# Patient Record
Sex: Male | Born: 1975 | Race: White | Hispanic: No | Marital: Married | Smoking: Former smoker
Health system: Southern US, Community
[De-identification: ages and names within clinical notes are randomized; demographics above are authoritative.]

## PROBLEM LIST (undated history)

## (undated) DIAGNOSIS — E119 Type 2 diabetes mellitus without complications: Secondary | ICD-10-CM

## (undated) DIAGNOSIS — E66811 Obesity, class 1: Secondary | ICD-10-CM

## (undated) DIAGNOSIS — E782 Mixed hyperlipidemia: Secondary | ICD-10-CM

## (undated) DIAGNOSIS — K219 Gastro-esophageal reflux disease without esophagitis: Secondary | ICD-10-CM

## (undated) DIAGNOSIS — I10 Essential (primary) hypertension: Secondary | ICD-10-CM

## (undated) DIAGNOSIS — E669 Obesity, unspecified: Secondary | ICD-10-CM

## (undated) HISTORY — PX: NO PAST SURGERIES: SHX2092

---

## 2019-12-02 ENCOUNTER — Encounter: Payer: Self-pay | Admitting: Emergency Medicine

## 2019-12-02 ENCOUNTER — Emergency Department
Admission: EM | Admit: 2019-12-02 | Discharge: 2019-12-02 | Disposition: A | Discharge status: Home / Self Care | Payer: 59 | Attending: Emergency Medicine | Admitting: Emergency Medicine

## 2019-12-02 ENCOUNTER — Other Ambulatory Visit: Payer: Self-pay

## 2019-12-02 ENCOUNTER — Emergency Department: Payer: 59

## 2019-12-02 DIAGNOSIS — N492 Inflammatory disorders of scrotum: Secondary | ICD-10-CM | POA: Insufficient documentation

## 2019-12-02 DIAGNOSIS — N5089 Other specified disorders of the male genital organs: Secondary | ICD-10-CM | POA: Insufficient documentation

## 2019-12-02 DIAGNOSIS — L0390 Cellulitis, unspecified: Secondary | ICD-10-CM

## 2019-12-02 DIAGNOSIS — N50812 Left testicular pain: Secondary | ICD-10-CM | POA: Insufficient documentation

## 2019-12-02 DIAGNOSIS — R1032 Left lower quadrant pain: Secondary | ICD-10-CM

## 2019-12-02 DIAGNOSIS — R109 Unspecified abdominal pain: Secondary | ICD-10-CM | POA: Diagnosis present

## 2019-12-02 DIAGNOSIS — I10 Essential (primary) hypertension: Secondary | ICD-10-CM | POA: Diagnosis not present

## 2019-12-02 HISTORY — DX: Essential (primary) hypertension: I10

## 2019-12-02 LAB — URINALYSIS, COMPLETE (UACMP) WITH MICROSCOPIC
Bacteria, UA: NONE SEEN
Bilirubin Urine: NEGATIVE
Glucose, UA: NEGATIVE mg/dL
Hgb urine dipstick: NEGATIVE
Ketones, ur: NEGATIVE mg/dL
Leukocytes,Ua: NEGATIVE
Nitrite: NEGATIVE
Protein, ur: NEGATIVE mg/dL
Specific Gravity, Urine: 1.017 (ref 1.005–1.030)
Squamous Epithelial / HPF: NONE SEEN (ref 0–5)
pH: 5 (ref 5.0–8.0)

## 2019-12-02 MED ORDER — IBUPROFEN 600 MG PO TABS
600.0000 mg | ORAL_TABLET | Freq: Three times a day (TID) | ORAL | 0 refills | Status: AC | PRN
Start: 2019-12-02 — End: ?

## 2019-12-02 MED ORDER — DOXYCYCLINE HYCLATE 100 MG PO CAPS
100.0000 mg | ORAL_CAPSULE | Freq: Two times a day (BID) | ORAL | 0 refills | Status: AC
Start: 2019-12-02 — End: 2019-12-09

## 2019-12-02 MED ORDER — HYDROCODONE-ACETAMINOPHEN 5-325 MG PO TABS: 1.0000 | ORAL_TABLET | Freq: Four times a day (QID) | ORAL | 0 refills | Status: AC | PRN

## 2019-12-02 NOTE — ED Provider Notes (Signed)
[facility] Emergency Department Provider Note  ____________________________________________   First MD Initiated Contact with Patient 12/02/19 1448     (approximate)  I have reviewed the triage vital signs and the nursing notes.   HISTORY  Chief Complaint Groin Pain and Testicle Pain    HPI Roger Harris is a 44 y.o. male  With h/o HTN here with groin pain, testicle pain. Pt reports that for the past 24 hours, he's had progressively worsening aching, throbbing, and now severe 10/10 left groin and testicular pain. Pain began gradually. No testicular twisting or asymmetry. No trauma but he does walk outside a lot for work and also lifts occasional heavy manhole covers. No fever, chills. No dysuria, frequency. He thought it may be related to constipation so he took ex-lax, without relief. No nausea, vomiting, or chills. Pain relieves with rest.        Past Medical History:  Diagnosis Date  . Hypertension     There are no problems to display for this patient.     Prior to Admission medications   Medication Sig Start Date End Date Taking? Authorizing Provider  buPROPion (WELLBUTRIN SR) 150 MG 12 hr tablet Take 150 mg by mouth 2 (two) times daily.   Yes [provider]  hydrochlorothiazide (HYDRODIURIL) 25 MG tablet Take 25 mg by mouth daily.   Yes [provider]  losartan (COZAAR) 50 MG tablet Take 50 mg by mouth daily.   Yes [provider]  lovastatin (MEVACOR) 40 MG tablet Take 40 mg by mouth at bedtime.   Yes [provider]  doxycycline (VIBRAMYCIN) 100 MG capsule Take 1 capsule (100 mg total) by mouth 2 (two) times daily for 7 days. 12/02/19 12/09/19  Shaune Pollack, MD  HYDROcodone-acetaminophen (NORCO/VICODIN) 5-325 MG tablet Take 1-2 tablets by mouth every 6 (six) hours as needed for moderate pain or severe pain. 12/02/19 12/01/20  Shaune Pollack, MD  ibuprofen (ADVIL) 600 MG tablet Take 1 tablet (600 mg total)  by mouth every 8 (eight) hours as needed for mild pain. 12/02/19   Shaune Pollack, MD    Allergies Patient has no known allergies.  No family history on file.  Social History Social History   Tobacco Use  . Smoking status: Not on file  Substance Use Topics  . Alcohol use: Not on file  . Drug use: Not on file    Review of Systems  Review of Systems  Constitutional: Negative for chills and fever.  HENT: Negative for sore throat.   Respiratory: Negative for shortness of breath.   Cardiovascular: Negative for chest pain.  Gastrointestinal: Negative for abdominal pain.  Genitourinary: Positive for scrotal swelling and testicular pain. Negative for flank pain.  Musculoskeletal: Negative for neck pain.  Skin: Negative for rash and wound.  Allergic/Immunologic: Negative for immunocompromised state.  Neurological: Negative for weakness and numbness.  Hematological: Does not bruise/bleed easily.     ____________________________________________  PHYSICAL EXAM:      VITAL SIGNS: ED Triage Vitals  Enc Vitals Group     BP 12/02/19 1347 125/88     Pulse Rate 12/02/19 1347 87     Resp 12/02/19 1347 18     Temp 12/02/19 1347 98.9 F (37.2 C)     Temp Source 12/02/19 1347 Oral     SpO2 12/02/19 1347 96 %     Weight 12/02/19 1348 240 lb (108.9 kg)     Height 12/02/19 1348 5\' 10"  (1.778 m)  Head Circumference --      Peak Flow --      Pain Score 12/02/19 1348 9     Pain Loc --      Pain Edu? --      Excl. in GC? --      Physical Exam Vitals and nursing note reviewed.  Constitutional:      General: He is not in acute distress.    Appearance: He is well-developed.  HENT:     Head: Normocephalic and atraumatic.  Eyes:     Conjunctiva/sclera: Conjunctivae normal.  Cardiovascular:     Rate and Rhythm: Normal rate and regular rhythm.     Heart sounds: Normal heart sounds.  Pulmonary:     Effort: Pulmonary effort is normal. No respiratory distress.     Breath sounds:  No wheezing.  Abdominal:     General: There is no distension.  Genitourinary:    Comments: Marked TTP over left hemiscrotum. No overt erythema. Possible slight induration. Normal testicular lie and cremasterics are intact. No appreciable inguinal hernia. Musculoskeletal:     Cervical back: Neck supple.  Skin:    General: Skin is warm.     Capillary Refill: Capillary refill takes less than 2 seconds.     Findings: No rash.  Neurological:     Mental Status: He is alert and oriented to person, place, and time.     Motor: No abnormal muscle tone.       ____________________________________________   LABS (all labs ordered are listed, but only abnormal results are displayed)  Labs Reviewed  URINALYSIS, COMPLETE (UACMP) WITH MICROSCOPIC - Abnormal; Notable for the following components:      Result Value   Color, Urine YELLOW (*)    APPearance CLEAR (*)    All other components within normal limits    ____________________________________________  EKG: none ________________________________________  RADIOLOGY All imaging, including plain films, CT scans, and ultrasounds, independently reviewed by me, and interpretations confirmed via formal radiology reads.  ED MD interpretation:   [place]: Mild asymmetric thickening of left scrotal wall, small hydrocele  Official radiology report(s): US SCROTUM W/DOPPLER  Result Date: 12/02/2019 CLINICAL DATA:  Left testicular and groin pain.  Remote vasectomy. EXAM: SCROTAL ULTRASOUND DOPPLER ULTRASOUND OF THE TESTICLES TECHNIQUE: Complete ultrasound examination of the testicles, epididymis, and other scrotal structures was performed. Color and spectral Doppler ultrasound were also utilized to evaluate blood flow to the testicles. COMPARISON:  None. FINDINGS: Right testicle Measurements: 4.5 x 2.5 x 3.1 cm. No mass or microlithiasis visualized. Normal parenchymal echogenicity and echotexture. Normal parenchymal color flow vascularity. Left testicle  Measurements: 3.9 x 2.6 x 3.0 cm. No mass or microlithiasis visualized. Normal parenchymal echogenicity and echotexture. Normal parenchymal color flow vascularity. Right epididymis: Normal in size and appearance. Normal vascularity. Left epididymis: Normal in size and appearance. Normal vascularity. Hydrocele:  Tiny left simple appearing hydrocele is present. Varicocele:  None visualized. Pulsed Doppler interrogation of both testes demonstrates normal low resistance arterial and venous waveforms bilaterally. Other: There is mild asymmetric thickening of the left scrotal wall, possibly related to edema or inflammation. IMPRESSION: Tiny left hydrocele, nonspecific. Mild asymmetric thickening of the left scrotal wall, possibly related to asymmetric edema or inflammation. Normal examination of the scrotal contents. Normal testicular vascularity. Electronically Signed   By: Helyn Numbers MD   On: 12/02/2019 15:44    ____________________________________________  PROCEDURES   Procedure(s) performed (including Critical Care):  Procedures  ____________________________________________  INITIAL IMPRESSION / MDM / ASSESSMENT  AND PLAN / ED COURSE  As part of my medical decision making, I reviewed the following data within the electronic MEDICAL RECORD NUMBER Nursing notes reviewed and incorporated, Old chart reviewed, Notes from prior ED visits, and [place] Controlled Substance Database       *Rodman Recupero was evaluated in Emergency Department on 12/02/2019 for the symptoms described in the history of present illness. He was evaluated in the context of the global COVID-19 pandemic, which necessitated consideration that the patient might be at risk for infection with the SARS-CoV-2 virus that causes COVID-19. Institutional protocols and algorithms that pertain to the evaluation of patients at risk for COVID-19 are in a state of rapid change based on information released by regulatory bodies including the CDC and  federal and state organizations. These policies and algorithms were followed during the patient's care in the ED.  Some ED evaluations and interventions may be delayed as a result of limited staffing during the pandemic.*     Medical Decision Making:  44 yo M here with left scrotal pain. On exam, pt has no signs of torsion. U/S obtained shows thickening of scrotal wall concerning for edema vs infection, but no abscess, torsion, mass, or other abnormality. UA is without signs of UTI. History, exam is not concerning for STD/orchitis/epididymitis. Suspect mild scrotal wall cellulitis 2/2 microtrauma at his job/chafing. Will treat with doxy for broad-spectrum and MRSA coverage, encourage good hygiene and monitoring, and d/c with analgesics.  ____________________________________________  FINAL CLINICAL IMPRESSION(S) / ED DIAGNOSES  Final diagnoses:  Pain in left testicle  Cellulitis, unspecified cellulitis site     MEDICATIONS GIVEN DURING THIS VISIT:  Medications - No data to display   ED Discharge Orders         Ordered    doxycycline (VIBRAMYCIN) 100 MG capsule  2 times daily     Discontinue  Reprint     12/02/19 1629    ibuprofen (ADVIL) 600 MG tablet  Every 8 hours PRN     Discontinue  Reprint     12/02/19 1629    HYDROcodone-acetaminophen (NORCO/VICODIN) 5-325 MG tablet  Every 6 hours PRN     Discontinue  Reprint     12/02/19 1629           Note:  This document was prepared using Dragon voice recognition software and may include unintentional dictation errors.   Shaune Pollack, MD 12/02/19 [phone]

## 2019-12-02 NOTE — ED Triage Notes (Signed)
Patient presents to the ED with left groin pain and left testicular pain since yesterday.  Patient states he has been urinating less than normal.  Patient states his left testicle is slightly swollen.  Patient states testicle is tender to the touch.

## 2019-12-02 NOTE — Discharge Instructions (Signed)
Wear tighter fitting underwear to help support your testicle  Try to minimize heavy lifting  Try to keep the area as dry and clean as possible  Take the antibiotics as prescribed

## 2019-12-02 NOTE — ED Notes (Signed)
See triage note  Presents with left groin and testicle pain   States pain started yesterday  No urinary sx's

## 2020-12-21 ENCOUNTER — Encounter: Payer: Self-pay | Admitting: *Deleted

## 2020-12-22 ENCOUNTER — Encounter: Payer: Self-pay | Admitting: *Deleted

## 2020-12-22 ENCOUNTER — Encounter
Admission: RE | Disposition: A | Discharge status: Home / Self Care | Payer: Self-pay | Source: Home / Self Care | Attending: Gastroenterology

## 2020-12-22 ENCOUNTER — Ambulatory Visit: Payer: 59 | Admitting: Anesthesiology

## 2020-12-22 ENCOUNTER — Ambulatory Visit
Admission: RE | Admit: 2020-12-22 | Discharge: 2020-12-22 | Disposition: A | Discharge status: Home / Self Care | Payer: 59 | Attending: Gastroenterology | Admitting: Gastroenterology

## 2020-12-22 DIAGNOSIS — Z888 Allergy status to other drugs, medicaments and biological substances status: Secondary | ICD-10-CM | POA: Diagnosis not present

## 2020-12-22 DIAGNOSIS — Z79899 Other long term (current) drug therapy: Secondary | ICD-10-CM | POA: Insufficient documentation

## 2020-12-22 DIAGNOSIS — Z1211 Encounter for screening for malignant neoplasm of colon: Secondary | ICD-10-CM | POA: Insufficient documentation

## 2020-12-22 DIAGNOSIS — Z8371 Family history of colonic polyps: Secondary | ICD-10-CM | POA: Insufficient documentation

## 2020-12-22 DIAGNOSIS — Z87891 Personal history of nicotine dependence: Secondary | ICD-10-CM | POA: Insufficient documentation

## 2020-12-22 DIAGNOSIS — Z7984 Long term (current) use of oral hypoglycemic drugs: Secondary | ICD-10-CM | POA: Diagnosis not present

## 2020-12-22 HISTORY — DX: Gastro-esophageal reflux disease without esophagitis: K21.9

## 2020-12-22 HISTORY — DX: Obesity, class 1: E66.811

## 2020-12-22 HISTORY — DX: Mixed hyperlipidemia: E78.2

## 2020-12-22 HISTORY — DX: Obesity, unspecified: E66.9

## 2020-12-22 HISTORY — DX: Type 2 diabetes mellitus without complications: E11.9

## 2020-12-22 HISTORY — PX: COLONOSCOPY WITH PROPOFOL: SHX5780

## 2020-12-22 LAB — GLUCOSE, CAPILLARY: Glucose-Capillary: 109 mg/dL — ABNORMAL HIGH (ref 70–99)

## 2020-12-22 SURGERY — COLONOSCOPY WITH PROPOFOL
Anesthesia: General

## 2020-12-22 MED ORDER — PROPOFOL 500 MG/50ML IV EMUL
INTRAVENOUS | Status: DC | PRN
  Administered 2020-12-22: 140 ug/kg/min via INTRAVENOUS

## 2020-12-22 MED ORDER — SODIUM CHLORIDE 0.9 % IV SOLN
INTRAVENOUS | Status: DC
  Administered 2020-12-22: 20 mL/h via INTRAVENOUS

## 2020-12-22 MED ORDER — DEXMEDETOMIDINE (PRECEDEX) IN NS 20 MCG/5ML (4 MCG/ML) IV SYRINGE
PREFILLED_SYRINGE | INTRAVENOUS | Status: DC | PRN
  Administered 2020-12-22: 8 ug via INTRAVENOUS

## 2020-12-22 MED ORDER — PROPOFOL 10 MG/ML IV BOLUS
INTRAVENOUS | Status: DC | PRN
  Administered 2020-12-22: 70 mg via INTRAVENOUS

## 2020-12-22 NOTE — Anesthesia Postprocedure Evaluation (Signed)
Anesthesia Post Note  Patient: Roger Harris  Procedure(s) Performed: COLONOSCOPY WITH PROPOFOL  Patient location during evaluation: PACU Anesthesia Type: General Level of consciousness: awake and alert Pain management: pain level controlled Vital Signs Assessment: post-procedure vital signs reviewed and stable Respiratory status: spontaneous breathing, nonlabored ventilation, respiratory function stable and patient connected to nasal cannula oxygen Cardiovascular status: blood pressure returned to baseline and stable Postop Assessment: no apparent nausea or vomiting Anesthetic complications: no   No notable events documented.   Last Vitals:  Vitals:   12/22/20 1200 12/22/20 1210  BP: (!) 88/65 102/79  Pulse: 62 63  Resp: 14 14  Temp:    SpO2: 96% 97%    Last Pain:  Vitals:   12/22/20 1210  TempSrc:   PainSc: 0-No pain                 Yevette Edwards

## 2020-12-22 NOTE — H&P (Signed)
[facility] Pre-Procedure H&P   Patient ID: Roger Harris is a 45 y.o. male.  Gastroenterology Provider: [name], DO  Referring Provider: Dr. Burnadette Pop PCP: Marisue Ivan, MD  Date: 12/22/2020  HPI Mr. Roger Harris is a 45 y.o. male who presents today for Colonoscopy for  initial screening colonoscopy, family history polyps (mother).  Patient denies nausea, vomiting, coffee ground emesis, hematemesis, abdominal pain, diarrhea, constipation, melena, hematochezia, fever, chills.   Past Medical History:  Diagnosis Date   Diabetes mellitus without complication (HCC)    GERD (gastroesophageal reflux disease)    Hypertension    Mixed hyperlipidemia    Obesity (BMI 30.0-34.9)     Past Surgical History:  Procedure Laterality Date   NO PAST SURGERIES      Family History Mother- colon polyps No other h/o GI disease or malignancy  Review of Systems  Constitutional:  Negative for activity change, appetite change, chills, fatigue, fever and unexpected weight change.  HENT:  Negative for trouble swallowing and voice change.   Respiratory:  Negative for shortness of breath.   Cardiovascular:  Negative for chest pain and palpitations.  Gastrointestinal:  Negative for abdominal distention, abdominal pain, anal bleeding, blood in stool, constipation, diarrhea, nausea and vomiting.  Musculoskeletal:  Negative for arthralgias and myalgias.  Skin:  Negative for color change and pallor.  Neurological:  Negative for dizziness, syncope and weakness.  Psychiatric/Behavioral:  Negative for confusion. The patient is not nervous/anxious.   All other systems reviewed and are negative.   Medications No current facility-administered medications on file prior to encounter.   Current Outpatient Medications on File Prior to Encounter  Medication Sig Dispense Refill   amLODipine (NORVASC) 5 MG tablet Take 5 mg by mouth daily.     atorvastatin (LIPITOR) 40 MG tablet  Take 40 mg by mouth daily.     buPROPion (WELLBUTRIN SR) 150 MG 12 hr tablet Take 150 mg by mouth 2 (two) times daily.     fenofibrate 160 MG tablet Take 160 mg by mouth daily.     glucosamine-chondroitin 500-400 MG tablet Take 1 tablet by mouth 3 (three) times daily.     hydrochlorothiazide (HYDRODIURIL) 25 MG tablet Take 25 mg by mouth daily.     ibuprofen (ADVIL) 600 MG tablet Take 1 tablet (600 mg total) by mouth every 8 (eight) hours as needed for mild pain. 20 tablet 0   losartan (COZAAR) 50 MG tablet Take 50 mg by mouth daily.     metFORMIN (GLUCOPHAGE) 500 MG tablet Take by mouth 2 (two) times daily with a meal.     Omega-3 Fatty Acids (EQL OMEGA 3 FISH OIL) 1000 MG CAPS Take by mouth.     omeprazole (PRILOSEC) 20 MG capsule Take 20 mg by mouth daily.     lovastatin (MEVACOR) 40 MG tablet Take 40 mg by mouth at bedtime.      Pertinent medications related to GI and procedure were reviewed by me with the patient prior to the procedure   Current Facility-Administered Medications:    0.9 %  sodium chloride infusion, , Intravenous, Continuous, [name], DO, Last Rate: 20 mL/hr at 12/22/20 0958, 20 mL/hr at 12/22/20 0958  sodium chloride 20 mL/hr (12/22/20 0958)       Allergies  Allergen Reactions   Chantix [Varenicline] Nausea Only   Allergies were reviewed by me prior to the procedure  Objective    Vitals:   12/22/20 0944  BP: 113/78  Pulse: 72  Resp:  20  Temp: (!) 96 F (35.6 C)  TempSrc: Temporal  SpO2: 99%  Weight: 104.3 kg  Height: 5\' 10"  (1.778 m)     Physical Exam Vitals and nursing note reviewed.  Constitutional:      General: He is not in acute distress.    Appearance: Normal appearance. He is not ill-appearing, toxic-appearing or diaphoretic.     Comments: overweight  HENT:     Head: Normocephalic and atraumatic.     Nose: Nose normal.     Mouth/Throat:     Mouth: Mucous membranes are moist.     Pharynx: Oropharynx is clear.  Eyes:      General: No scleral icterus.    Extraocular Movements: Extraocular movements intact.  Cardiovascular:     Rate and Rhythm: Normal rate and regular rhythm.     Heart sounds: Normal heart sounds. No murmur heard.   No friction rub. No gallop.  Pulmonary:     Effort: Pulmonary effort is normal. No respiratory distress.     Breath sounds: Normal breath sounds. No wheezing, rhonchi or rales.  Abdominal:     General: Bowel sounds are normal. There is no distension.     Palpations: Abdomen is soft.     Tenderness: There is no abdominal tenderness. There is no guarding or rebound.  Musculoskeletal:     Cervical back: Neck supple.     Right lower leg: No edema.     Left lower leg: No edema.  Skin:    General: Skin is warm and dry.     Coloration: Skin is not jaundiced or pale.  Neurological:     General: No focal deficit present.     Mental Status: He is alert and oriented to person, place, and time. Mental status is at baseline.  Psychiatric:        Mood and Affect: Mood normal.        Behavior: Behavior normal.        Thought Content: Thought content normal.        Judgment: Judgment normal.     Assessment:  Mr. Roger Harris is a 45 y.o. male  who presents today for Colonoscopy for initial screening colonoscopy, family history polyps (mother).  Plan:  Colonoscopy with possible intervention today  Colonoscopy with possible biopsy, control of bleeding, polypectomy, and interventions as necessary has been discussed with the patient/patient representative. Informed consent was obtained from the patient/patient representative after explaining the indication, Roger, and risks of the procedure including but not limited to death, bleeding, perforation, missed neoplasm/lesions, cardiorespiratory compromise, and reaction to medications. Opportunity for questions was given and appropriate answers were provided. Patient/patient representative has verbalized understanding is amenable to  undergoing the procedure.  48, DO  [facility]  Portions of the record may have been created with voice recognition software. Occasional wrong-word or 'sound-a-like' substitutions may have occurred due to the inherent limitations of voice recognition software.  Read the chart carefully and recognize, using context, where substitutions may have occurred.

## 2020-12-22 NOTE — Anesthesia Preprocedure Evaluation (Signed)
Anesthesia Evaluation  Patient identified by MRN, date of birth, ID band Patient awake    Reviewed: Allergy & Precautions, NPO status , Patient's Chart, lab work & pertinent test results  Airway Mallampati: III  TM Distance: <3 FB Neck ROM: full    Dental  (+) Chipped   Pulmonary former smoker,    Pulmonary exam normal        Cardiovascular hypertension, Normal cardiovascular exam     Neuro/Psych negative neurological ROS  negative psych ROS   GI/Hepatic Neg liver ROS, GERD  Medicated and Controlled,  Endo/Other  diabetes, Type 2  Renal/GU negative Renal ROS  negative genitourinary   Musculoskeletal   Abdominal   Peds  Hematology negative hematology ROS (+)   Anesthesia Other Findings Past Medical History: No date: Diabetes mellitus without complication (HCC) No date: GERD (gastroesophageal reflux disease) No date: Hypertension No date: Mixed hyperlipidemia No date: Obesity (BMI 30.0-34.9)  Past Surgical History: No date: NO PAST SURGERIES  BMI    Body Mass Index: 33.00 kg/m      Reproductive/Obstetrics negative OB ROS                             Anesthesia Physical Anesthesia Plan  ASA: 3  Anesthesia Plan: General   Post-op Pain Management:    Induction: Intravenous  PONV Risk Score and Plan: Propofol infusion and TIVA  Airway Management Planned: Natural Airway and Nasal Cannula  Additional Equipment:   Intra-op Plan:   Post-operative Plan:   Informed Consent: I have reviewed the patients History and Physical, chart, labs and discussed the procedure including the risks, benefits and alternatives for the proposed anesthesia with the patient or authorized representative who has indicated his/her understanding and acceptance.     Dental Advisory Given  Plan Discussed with: Anesthesiologist, CRNA and Surgeon  Anesthesia Plan Comments: (Patient consented for  risks of anesthesia including but not limited to:  - adverse reactions to medications - risk of airway placement if required - damage to eyes, teeth, lips or other oral mucosa - nerve damage due to positioning  - sore throat or hoarseness - Damage to heart, brain, nerves, lungs, other parts of body or loss of life  Patient voiced understanding.)        Anesthesia Quick Evaluation

## 2020-12-22 NOTE — Interval H&P Note (Signed)
History and Physical Interval Note: Preprocedure H&P from 12/22/20  was reviewed and there was no interval change after seeing and examining the patient.  Written consent was obtained from the patient after discussion of risks, benefits, and alternatives. Patient has consented to proceed with Colonoscopy with possible intervention   12/22/2020 11:09 AM  Roger Harris  has presented today for surgery, with the diagnosis of Z12.11 Colon Cancer Screening.  The various methods of treatment have been discussed with the patient and family. After consideration of risks, benefits and other options for treatment, the patient has consented to  Procedure(s) with comments: COLONOSCOPY WITH PROPOFOL (N/A) - DM as a surgical intervention.  The patient's history has been reviewed, patient examined, no change in status, stable for surgery.  I have reviewed the patient's chart and labs.  Questions were answered to the patient's satisfaction.     [name]

## 2020-12-22 NOTE — Transfer of Care (Signed)
Immediate Anesthesia Transfer of Care Note  Patient: Roger Harris  Procedure(s) Performed: COLONOSCOPY WITH PROPOFOL  Patient Location: PACU  Anesthesia Type:General  Level of Consciousness: sedated  Airway & Oxygen Therapy: Patient Spontanous Breathing  Post-op Assessment: Report given to RN and Post -op Vital signs reviewed and stable  Post vital signs: Reviewed and stable  Last Vitals:  Vitals Value Taken Time  BP 90/65 12/22/20 1139  Temp    Pulse 64 12/22/20 1140  Resp 18 12/22/20 1140  SpO2 98 % 12/22/20 1140  Vitals shown include unvalidated device data.  Last Pain:  Vitals:   12/22/20 0944  TempSrc: Temporal  PainSc: 0-No pain         Complications: No notable events documented.

## 2020-12-22 NOTE — Op Note (Signed)
[facility] Patient Name: Roger Harris Procedure Date: 12/22/2020 11:07 AM MRN: [id number] Account #: [id number] Date of Birth: Jan 19, 1976 Admit Type: Outpatient Age: 45 Room: [facility] ENDO ROOM 1 Gender: Male Note Status: Finalized Instrument Name: Jasper Riling [id number] Procedure:             Colonoscopy Indications:           Colon cancer screening in patient at increased risk:                         Family history of 1st-degree relative with colon polyps Providers:             Rueben Bash, DO Referring MD:          Dion Body (Referring MD) Medicines:             Monitored Anesthesia Care Complications:         No immediate complications. Estimated blood loss:                         Minimal. Procedure:             Pre-Anesthesia Assessment:                        - Prior to the procedure, a History and Physical was                         performed, and patient medications and allergies were                         reviewed. The patient is competent. The risks and                         benefits of the procedure and the sedation options and                         risks were discussed with the patient. All questions                         were answered and informed consent was obtained.                         Patient identification and proposed procedure were                         verified by the physician, the nurse, the anesthetist                         and the technician in the endoscopy suite. Mental                         Status Examination: alert and oriented. Airway                         Examination: normal oropharyngeal airway and neck                         mobility. Respiratory Examination: clear to  auscultation. CV Examination: RRR, no murmurs, no S3                         or S4. Prophylactic Antibiotics: The patient does not                         require prophylactic antibiotics.  Prior                         Anticoagulants: The patient has taken no previous                         anticoagulant or antiplatelet agents. ASA Grade                         Assessment: II - A patient with mild systemic disease.                         After reviewing the risks and benefits, the patient                         was deemed in satisfactory condition to undergo the                         procedure. The anesthesia plan was to use monitored                         anesthesia care (MAC). Immediately prior to                         administration of medications, the patient was                         re-assessed for adequacy to receive sedatives. The                         heart rate, respiratory rate, oxygen saturations,                         blood pressure, adequacy of pulmonary ventilation, and                         response to care were monitored throughout the                         procedure. The physical status of the patient was                         re-assessed after the procedure.                        After obtaining informed consent, the colonoscope was                         passed under direct vision. Throughout the procedure,                         the patient's blood pressure, pulse, and oxygen  saturations were monitored continuously. The                         Colonoscope was introduced through the anus and                         advanced to the the terminal ileum, with                         identification of the appendiceal orifice and IC                         valve. The colonoscopy was performed without                         difficulty. The patient tolerated the procedure well.                         The quality of the bowel preparation was evaluated                         using the BBPS [facility] Bowel Preparation Scale) with                         scores of: Right Colon = 3, Transverse Colon = 3 and                          Left Colon = 3 (entire mucosa seen well with no                         residual staining, small fragments of stool or opaque                         liquid). The total BBPS score equals 9. The terminal                         ileum, ileocecal valve, appendiceal orifice, and                         rectum were photographed. Findings:      The perianal and digital rectal examinations were normal. Pertinent       negatives include normal sphincter tone.      Two sessile polyps were found in the transverse colon. The polyps were 2       to 3 mm in size. These polyps were removed with a cold biopsy forceps.       Resection and retrieval were complete. Estimated blood loss was minimal.      The entire examined colon appeared normal on direct and retroflexion       views.      The terminal ileum appeared normal.      The exam was otherwise without abnormality on direct and retroflexion       views. Impression:            - Two 2 to 3 mm polyps in the transverse colon,                         removed with a cold  biopsy forceps. Resected and                         retrieved.                        - The entire examined colon is normal on direct and                         retroflexion views.                        - The examined portion of the ileum was normal.                        - The examination was otherwise normal on direct and                         retroflexion views. Recommendation:        - Discharge patient to home.                        - Resume previous diet.                        - Continue present medications.                        - Await pathology results.                        - Repeat colonoscopy for surveillance based on                         pathology results.                        - Return to referring physician as previously                         scheduled. Procedure Code(s):     --- Professional ---                        [phone], Colonoscopy,  flexible; with biopsy, single or                         multiple Diagnosis Code(s):     --- Professional ---                        Z83.71, Family history of colonic polyps                        K63.5, Polyp of colon CPT copyright 2019 [employer]. All rights reserved. The codes documented in this report are preliminary and upon coder review may  be revised to meet current compliance requirements. Attending Participation:      I personally performed the entire procedure. Volney American, DO Annamaria Helling DO, DO 12/22/2020 11:39:10 AM This report has been signed electronically. Number of Addenda: 0 Note Initiated On: 12/22/2020 11:07 AM Scope Withdrawal Time: 0 hours 17 minutes 45 seconds  Total Procedure Duration: 0 hours 19 minutes  57 seconds  Estimated Blood Loss:  Estimated blood loss was minimal.      [facility]

## 2020-12-26 LAB — SURGICAL PATHOLOGY

## 2021-07-22 IMAGING — US US SCROTUM W/ DOPPLER COMPLETE
2 series · 13 of 25 positions shown · non-contrast
Comparison: None.

CLINICAL DATA: Left testicular and groin pain.  Remote vasectomy.

EXAM:
SCROTAL ULTRASOUND
DOPPLER ULTRASOUND OF THE TESTICLES
TECHNIQUE: Complete ultrasound examination of the testicles, epididymis, and
other scrotal structures was performed. Color and spectral Doppler
ultrasound were also utilized to evaluate blood flow to the
testicles.

[Series 1: us scrotum w/doppler · 12 of 70 slices shown]
[im 1/70]
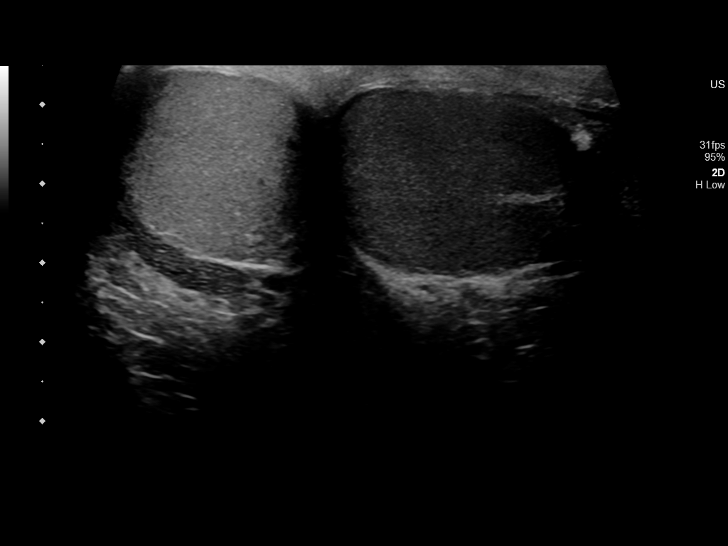
[im 7/70]
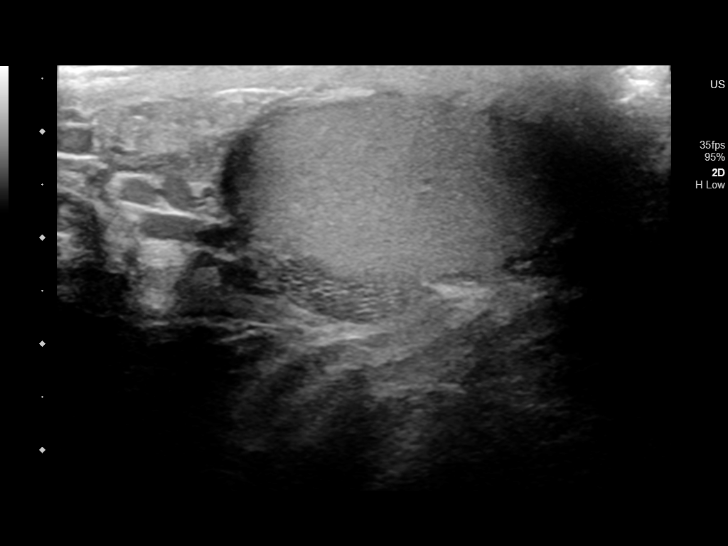
[im 13/70]
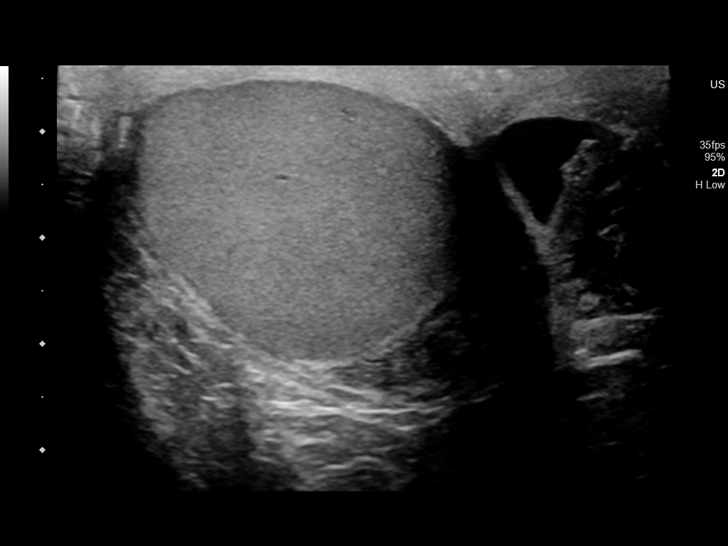
[im 19/70]
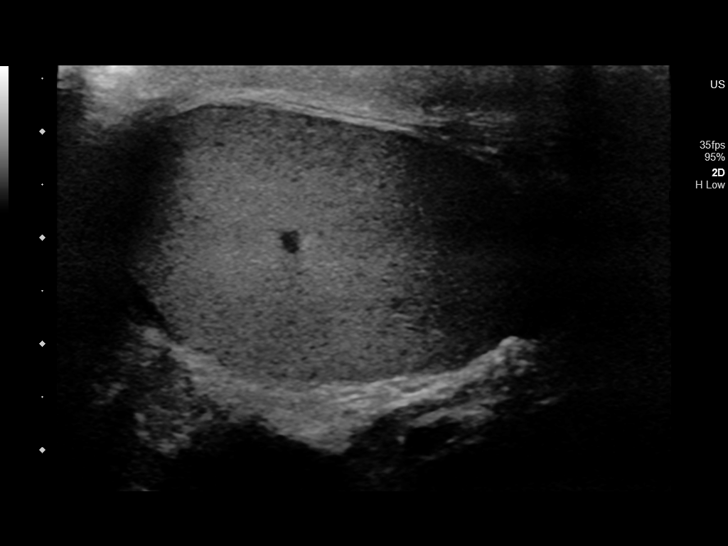
[im 25/70]
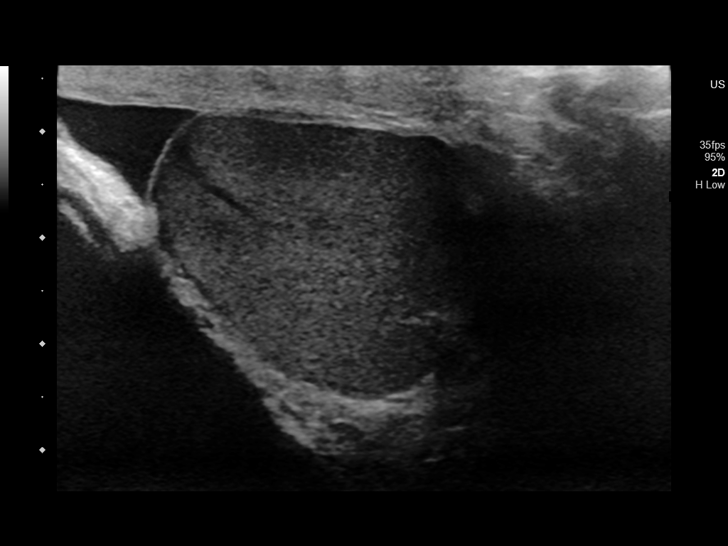
[im 31/70]
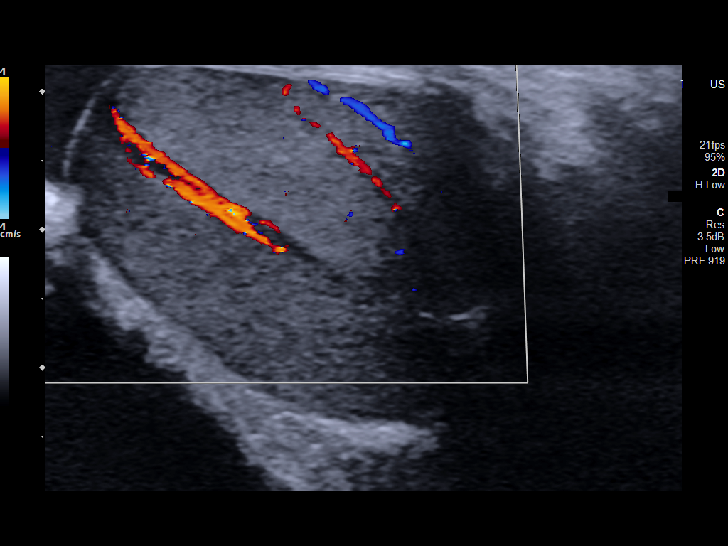
[im 37/70]
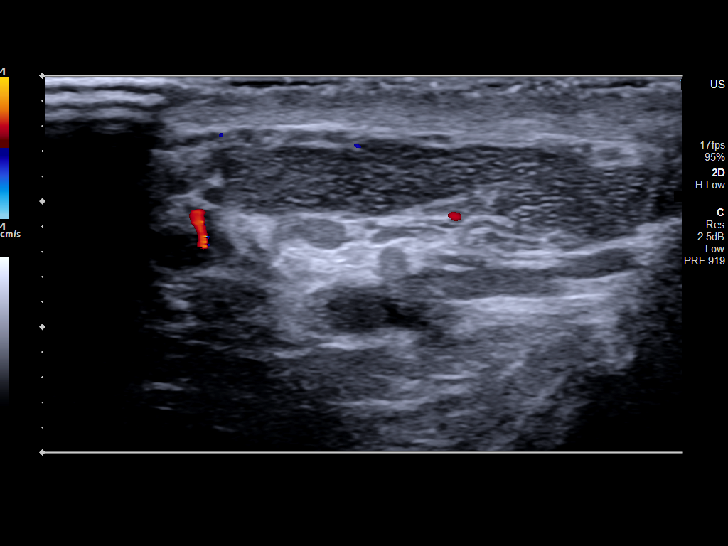
[im 43/70]
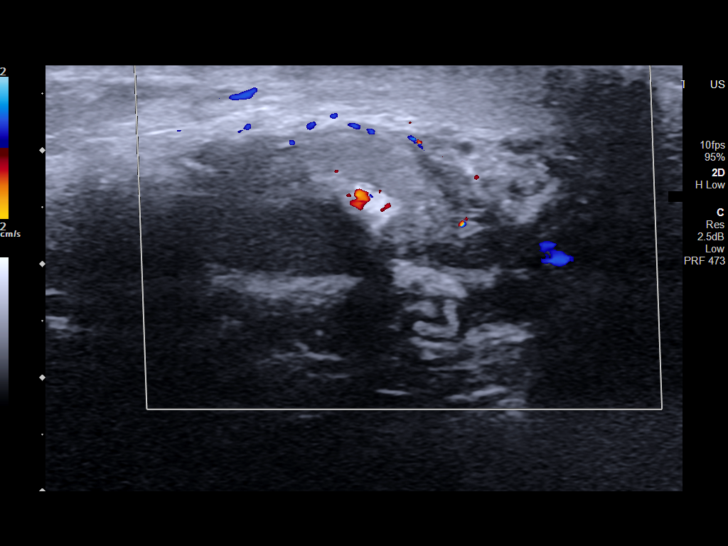
[im 49/70]
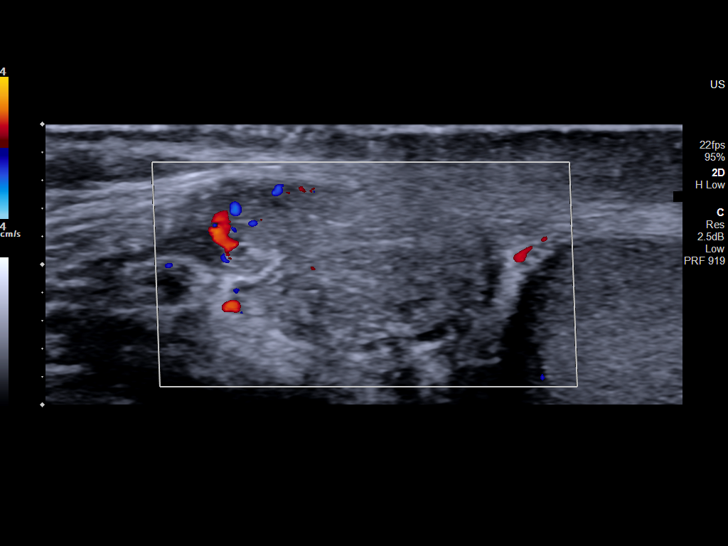
[im 55/70]
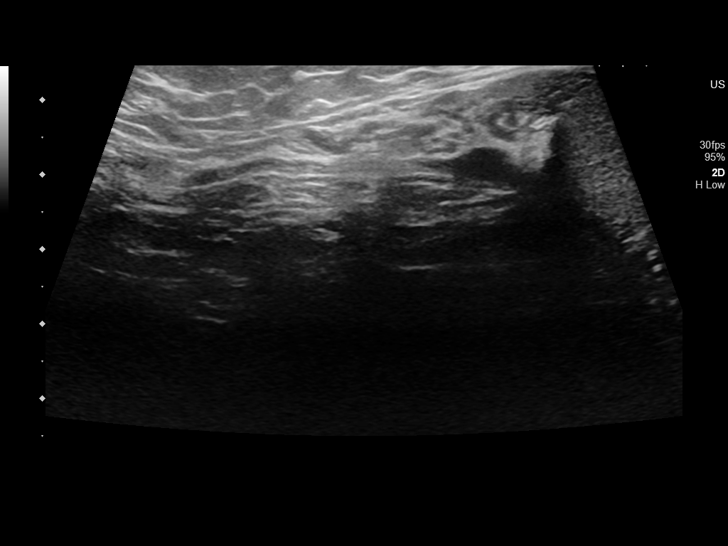
[im 61/70]
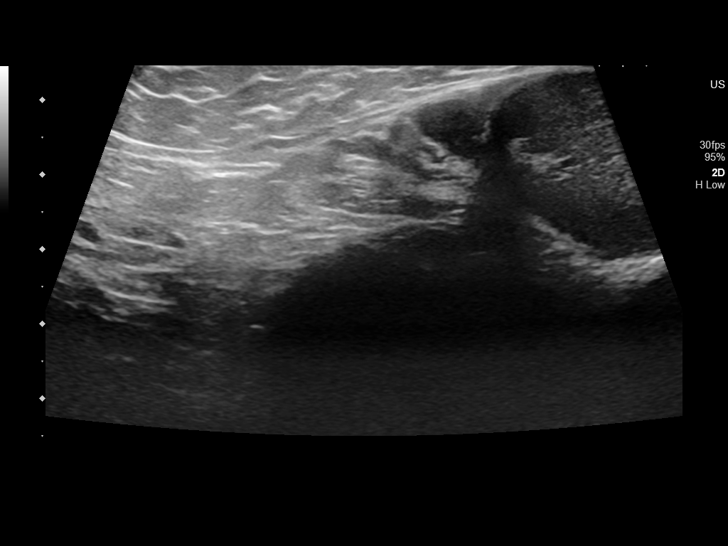
[im 67/70]
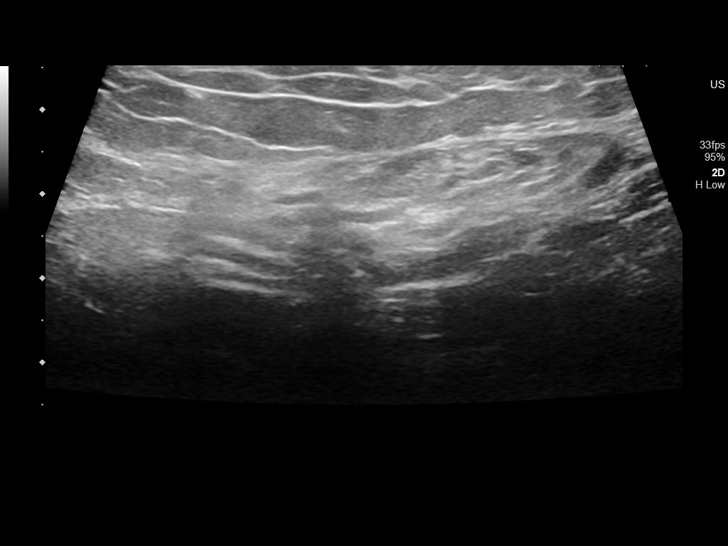

[Series 1001: testis us · 1 of 1 slices shown]
[im 1/1]
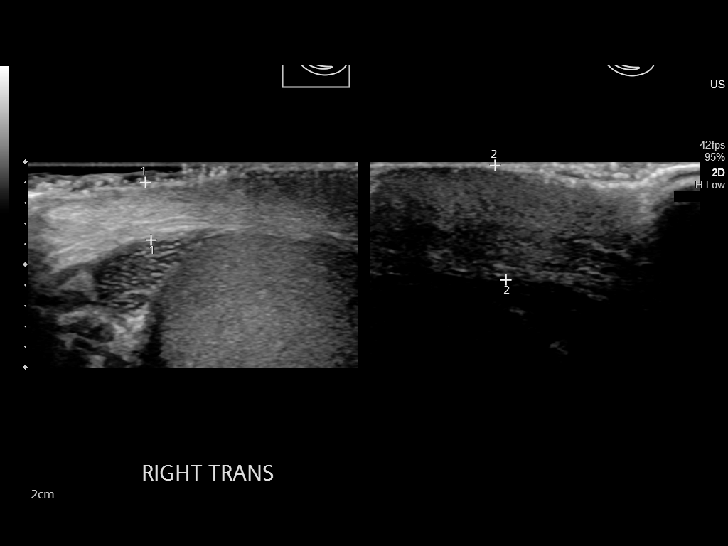

[13 of 25 positions shown; findings below may reference images not displayed]

FINDINGS: Right testicle

Measurements: 4.5 x 2.5 x 3.1 cm. No mass or microlithiasis
visualized. Normal parenchymal echogenicity and echotexture. Normal
parenchymal color flow vascularity.

Left testicle

Measurements: 3.9 x 2.6 x 3.0 cm. No mass or microlithiasis
visualized. Normal parenchymal echogenicity and echotexture. Normal
parenchymal color flow vascularity.

Right epididymis: Normal in size and appearance. Normal vascularity.

Left epididymis: Normal in size and appearance. Normal vascularity.

Hydrocele:  Tiny left simple appearing hydrocele is present.

Varicocele:  None visualized.

Pulsed Doppler interrogation of both testes demonstrates normal low
resistance arterial and venous waveforms bilaterally.

Other: There is mild asymmetric thickening of the left scrotal wall,
possibly related to edema or inflammation.
IMPRESSION: Tiny left hydrocele, nonspecific. Mild asymmetric thickening of the
left scrotal wall, possibly related to asymmetric edema or
inflammation. Normal examination of the scrotal contents. Normal
testicular vascularity.

## 2021-08-09 ENCOUNTER — Other Ambulatory Visit: Payer: Self-pay

## 2022-02-26 ENCOUNTER — Other Ambulatory Visit: Payer: Self-pay | Admitting: Family Medicine

## 2022-02-26 DIAGNOSIS — R0781 Pleurodynia: Secondary | ICD-10-CM

## 2022-03-01 ENCOUNTER — Other Ambulatory Visit: Payer: Self-pay | Admitting: Family Medicine

## 2022-03-01 DIAGNOSIS — R350 Frequency of micturition: Secondary | ICD-10-CM

## 2022-03-01 DIAGNOSIS — R109 Unspecified abdominal pain: Secondary | ICD-10-CM

## 2022-03-04 ENCOUNTER — Ambulatory Visit
Admission: RE | Admit: 2022-03-04 | Discharge: 2022-03-04 | Disposition: A | Discharge status: Home / Self Care | Payer: 59 | Source: Ambulatory Visit | Attending: Family Medicine | Admitting: Family Medicine

## 2022-03-04 ENCOUNTER — Other Ambulatory Visit: Payer: 59

## 2022-03-04 DIAGNOSIS — R0781 Pleurodynia: Secondary | ICD-10-CM

## 2022-03-12 ENCOUNTER — Ambulatory Visit
Admission: RE | Admit: 2022-03-12 | Discharge: 2022-03-12 | Disposition: A | Discharge status: Home / Self Care | Payer: 59 | Source: Ambulatory Visit | Attending: Family Medicine | Admitting: Family Medicine

## 2022-03-12 DIAGNOSIS — R109 Unspecified abdominal pain: Secondary | ICD-10-CM

## 2022-03-12 DIAGNOSIS — R350 Frequency of micturition: Secondary | ICD-10-CM

## 2022-03-12 MED ORDER — IOPAMIDOL (ISOVUE-300) INJECTION 61%
75.0000 mL | Freq: Once | INTRAVENOUS | Status: AC | PRN
  Administered 2022-03-12: 75 mL via INTRAVENOUS

## 2022-03-26 ENCOUNTER — Other Ambulatory Visit: Payer: Self-pay | Admitting: Family Medicine

## 2022-03-26 DIAGNOSIS — M5416 Radiculopathy, lumbar region: Secondary | ICD-10-CM

## 2022-04-20 ENCOUNTER — Ambulatory Visit
Admission: RE | Admit: 2022-04-20 | Discharge: 2022-04-20 | Disposition: A | Discharge status: Home / Self Care | Payer: 59 | Source: Ambulatory Visit | Attending: Family Medicine | Admitting: Family Medicine

## 2022-04-20 DIAGNOSIS — M5416 Radiculopathy, lumbar region: Secondary | ICD-10-CM
